# Patient Record
Sex: Female | Born: 1995 | Race: Black or African American | Hispanic: No | Marital: Single | Smoking: Never smoker
Health system: Southern US, Community
[De-identification: ages and names within clinical notes are randomized; demographics above are authoritative.]

## PROBLEM LIST (undated history)

## (undated) DIAGNOSIS — L309 Dermatitis, unspecified: Secondary | ICD-10-CM

## (undated) HISTORY — DX: Dermatitis, unspecified: L30.9

---

## 2020-08-09 ENCOUNTER — Other Ambulatory Visit: Payer: Self-pay

## 2020-08-09 ENCOUNTER — Encounter (HOSPITAL_BASED_OUTPATIENT_CLINIC_OR_DEPARTMENT_OTHER): Payer: Self-pay | Admitting: Emergency Medicine

## 2020-08-09 ENCOUNTER — Emergency Department (HOSPITAL_BASED_OUTPATIENT_CLINIC_OR_DEPARTMENT_OTHER)
Admission: EM | Admit: 2020-08-09 | Discharge: 2020-08-09 | Disposition: A | Discharge status: Home / Self Care | Payer: Medicaid Other | Attending: Emergency Medicine | Admitting: Emergency Medicine

## 2020-08-09 ENCOUNTER — Emergency Department (HOSPITAL_BASED_OUTPATIENT_CLINIC_OR_DEPARTMENT_OTHER): Payer: Medicaid Other

## 2020-08-09 DIAGNOSIS — R1013 Epigastric pain: Secondary | ICD-10-CM | POA: Insufficient documentation

## 2020-08-09 DIAGNOSIS — R112 Nausea with vomiting, unspecified: Secondary | ICD-10-CM | POA: Insufficient documentation

## 2020-08-09 LAB — URINALYSIS, ROUTINE W REFLEX MICROSCOPIC
Bilirubin Urine: NEGATIVE
Glucose, UA: NEGATIVE mg/dL
Ketones, ur: NEGATIVE mg/dL
Nitrite: NEGATIVE
Protein, ur: NEGATIVE mg/dL
Specific Gravity, Urine: 1.01 (ref 1.005–1.030)
pH: 7.5 (ref 5.0–8.0)

## 2020-08-09 LAB — CBC WITH DIFFERENTIAL/PLATELET
Abs Immature Granulocytes: 0.02 10*3/uL (ref 0.00–0.07)
Basophils Absolute: 0.1 10*3/uL (ref 0.0–0.1)
Basophils Relative: 1 %
Eosinophils Absolute: 0.2 10*3/uL (ref 0.0–0.5)
Eosinophils Relative: 2 %
HCT: 40.6 % (ref 36.0–46.0)
Hemoglobin: 13.1 g/dL (ref 12.0–15.0)
Immature Granulocytes: 0 %
Lymphocytes Relative: 46 %
Lymphs Abs: 4.2 10*3/uL — ABNORMAL HIGH (ref 0.7–4.0)
MCH: 30.4 pg (ref 26.0–34.0)
MCHC: 32.3 g/dL (ref 30.0–36.0)
MCV: 94.2 fL (ref 80.0–100.0)
Monocytes Absolute: 0.6 10*3/uL (ref 0.1–1.0)
Monocytes Relative: 6 %
Neutro Abs: 4.1 10*3/uL (ref 1.7–7.7)
Neutrophils Relative %: 45 %
Platelets: 244 10*3/uL (ref 150–400)
RBC: 4.31 MIL/uL (ref 3.87–5.11)
RDW: 11.4 % — ABNORMAL LOW (ref 11.5–15.5)
WBC: 9.1 10*3/uL (ref 4.0–10.5)
nRBC: 0 % (ref 0.0–0.2)

## 2020-08-09 LAB — URINALYSIS, MICROSCOPIC (REFLEX): RBC / HPF: >50 RBC/hpf (ref 0–5)

## 2020-08-09 LAB — COMPREHENSIVE METABOLIC PANEL
ALT: 13 U/L (ref 0–44)
AST: 16 U/L (ref 15–41)
Albumin: 4.4 g/dL (ref 3.5–5.0)
Alkaline Phosphatase: 49 U/L (ref 38–126)
Anion gap: 9 (ref 5–15)
BUN: 14 mg/dL (ref 6–20)
CO2: 25 mmol/L (ref 22–32)
Calcium: 9 mg/dL (ref 8.9–10.3)
Chloride: 104 mmol/L (ref 98–111)
Creatinine, Ser: 1.11 mg/dL — ABNORMAL HIGH (ref 0.44–1.00)
GFR, Estimated: >60 mL/min (ref >60)
Glucose, Bld: 139 mg/dL — ABNORMAL HIGH (ref 70–99)
Potassium: 3.4 mmol/L — ABNORMAL LOW (ref 3.5–5.1)
Sodium: 138 mmol/L (ref 135–145)
Total Bilirubin: 0.5 mg/dL (ref 0.3–1.2)
Total Protein: 7.4 g/dL (ref 6.5–8.1)

## 2020-08-09 LAB — PREGNANCY, URINE: Preg Test, Ur: NEGATIVE

## 2020-08-09 LAB — LIPASE, BLOOD: Lipase: 46 U/L (ref 11–51)

## 2020-08-09 MED ORDER — PANTOPRAZOLE SODIUM 40 MG IV SOLR
40.0000 mg | Freq: Once | INTRAVENOUS | Status: AC
  Administered 2020-08-09: 40 mg via INTRAVENOUS
  Filled 2020-08-09: qty 40

## 2020-08-09 MED ORDER — IOHEXOL 9 MG/ML PO SOLN
500.0000 mL | Freq: Two times a day (BID) | ORAL | Status: DC | PRN
  Administered 2020-08-09: 500 mL via ORAL

## 2020-08-09 MED ORDER — FENTANYL CITRATE (PF) 100 MCG/2ML IJ SOLN
50.0000 ug | Freq: Once | INTRAMUSCULAR | Status: DC
  Filled 2020-08-09: qty 2

## 2020-08-09 MED ORDER — IOHEXOL 300 MG/ML  SOLN
100.0000 mL | Freq: Once | INTRAMUSCULAR | Status: AC | PRN
  Administered 2020-08-09: 60 mL via INTRAVENOUS

## 2020-08-09 MED ORDER — ONDANSETRON HCL 4 MG/2ML IJ SOLN
4.0000 mg | Freq: Once | INTRAMUSCULAR | Status: AC
  Administered 2020-08-09: 4 mg via INTRAVENOUS
  Filled 2020-08-09: qty 2

## 2020-08-09 NOTE — ED Triage Notes (Signed)
Pt c/o awaking with abd pain just prior to arrival and vomited in lobby. Denies diarrhea.

## 2020-08-09 NOTE — ED Provider Notes (Signed)
[facility] Provider Note: Lowella Dell, MD, FACEP  CSN: [id number] MRN: [id number] ARRIVAL: 08/09/20 at 0302 ROOM: MH09/[facility]09   CHIEF COMPLAINT  Abdominal Pain   HISTORY OF PRESENT ILLNESS  08/09/20 3:15 AM Yvonne Oneal is a 24 y.o. female who was awakened from sleep just prior to arrival with epigastric pain.  She describes the pain is sharp and rates it as a 6 out of 10.  It is worse with movement or palpation.  She has had nausea and one episode of vomiting.  She denies fever, diarrhea or dysuria.  Her menses began 1 day ago.   History reviewed. No pertinent past medical history.  Past Surgical History:  Procedure Laterality Date  . CESAREAN SECTION      No family history on file.  Social History   Tobacco Use  . Smoking status: Never Smoker  . Smokeless tobacco: Never Used  Substance Use Topics  . Alcohol use: Yes  . Drug use: Not on file    Prior to Admission medications   Not on File    Allergies Other   REVIEW OF SYSTEMS  Negative except as noted here or in the History of Present Illness.   PHYSICAL EXAMINATION  Initial Vital Signs Blood pressure 110/90, pulse 94, temperature 98.2 F (36.8 C), temperature source Oral, resp. rate 18, height 5\' 1"  (1.549 m), weight 43.5 kg, last menstrual period 08/08/2020, SpO2 99 %.  Examination General: Well-developed, well-nourished female in no acute distress; appearance consistent with age of record HENT: normocephalic; atraumatic Eyes: pupils equal, round and reactive to light; extraocular muscles intact Neck: supple Heart: regular rate and rhythm Lungs: clear to auscultation bilaterally Abdomen: soft; nondistended; epigastric tenderness and mild suprapubic tenderness; bowel sounds present Extremities: No deformity; full range of motion; pulses normal Neurologic: Awake, alert and oriented; motor function intact in all extremities and symmetric; no facial droop Skin: Warm and dry Psychiatric:  Flat affect   RESULTS  Summary of this visit's results, reviewed and interpreted by myself:   EKG Interpretation  Date/Time:    Ventricular Rate:    PR Interval:    QRS Duration:   QT Interval:    QTC Calculation:   R Axis:     Text Interpretation:        Laboratory Studies: Results for orders placed or performed during the hospital encounter of 08/09/20 (from the past 24 hour(s))  Urinalysis, Routine w reflex microscopic Urine, Clean Catch     Status: Abnormal   Collection Time: 08/09/20  3:17 AM  Result Value Ref Range   Color, Urine AMBER (A) YELLOW   APPearance CLOUDY (A) CLEAR   Specific Gravity, Urine 1.010 1.005 - 1.030   pH 7.5 5.0 - 8.0   Glucose, UA NEGATIVE NEGATIVE mg/dL   Hgb urine dipstick LARGE (A) NEGATIVE   Bilirubin Urine NEGATIVE NEGATIVE   Ketones, ur NEGATIVE NEGATIVE mg/dL   Protein, ur NEGATIVE NEGATIVE mg/dL   Nitrite NEGATIVE NEGATIVE   Leukocytes,Ua TRACE (A) NEGATIVE  Pregnancy, urine     Status: None   Collection Time: 08/09/20  3:17 AM  Result Value Ref Range   Preg Test, Ur NEGATIVE NEGATIVE  Urinalysis, Microscopic (reflex)     Status: Abnormal   Collection Time: 08/09/20  3:17 AM  Result Value Ref Range   RBC / HPF >50 0 - 5 RBC/hpf   WBC, UA 6-10 0 - 5 WBC/hpf   Bacteria, UA FEW (A) NONE SEEN   Squamous Epithelial /  LPF 0-5 0 - 5  CBC with Differential/Platelet     Status: Abnormal   Collection Time: 08/09/20  3:24 AM  Result Value Ref Range   WBC 9.1 4.0 - 10.5 K/uL   RBC 4.31 3.87 - 5.11 MIL/uL   Hemoglobin 13.1 12.0 - 15.0 g/dL   HCT 50.5 36 - 46 %   MCV 94.2 80.0 - 100.0 fL   MCH 30.4 26.0 - 34.0 pg   MCHC 32.3 30.0 - 36.0 g/dL   RDW 39.7 (L) 67.3 - 41.9 %   Platelets 244 150 - 400 K/uL   nRBC 0.0 0.0 - 0.2 %   Neutrophils Relative % 45 %   Neutro Abs 4.1 1.7 - 7.7 K/uL   Lymphocytes Relative 46 %   Lymphs Abs 4.2 (H) 0.7 - 4.0 K/uL   Monocytes Relative 6 %   Monocytes Absolute 0.6 0.1 - 1.0 K/uL   Eosinophils  Relative 2 %   Eosinophils Absolute 0.2 0.0 - 0.5 K/uL   Basophils Relative 1 %   Basophils Absolute 0.1 0.0 - 0.1 K/uL   Immature Granulocytes 0 %   Abs Immature Granulocytes 0.02 0.00 - 0.07 K/uL  Comprehensive metabolic panel     Status: Abnormal   Collection Time: 08/09/20  3:24 AM  Result Value Ref Range   Sodium 138 135 - 145 mmol/L   Potassium 3.4 (L) 3.5 - 5.1 mmol/L   Chloride 104 98 - 111 mmol/L   CO2 25 22 - 32 mmol/L   Glucose, Bld 139 (H) 70 - 99 mg/dL   BUN 14 6 - 20 mg/dL   Creatinine, Ser 3.79 (H) 0.44 - 1.00 mg/dL   Calcium 9.0 8.9 - 02.4 mg/dL   Total Protein 7.4 6.5 - 8.1 g/dL   Albumin 4.4 3.5 - 5.0 g/dL   AST 16 15 - 41 U/L   ALT 13 0 - 44 U/L   Alkaline Phosphatase 49 38 - 126 U/L   Total Bilirubin 0.5 0.3 - 1.2 mg/dL   GFR, Estimated >09 >73 mL/min   Anion gap 9 5 - 15  Lipase, blood     Status: None   Collection Time: 08/09/20  3:24 AM  Result Value Ref Range   Lipase 46 11 - 51 U/L   Imaging Studies: CT ABDOMEN PELVIS W CONTRAST  Result Date: 08/09/2020 CLINICAL DATA:  Abdominal pain and vomiting. EXAM: CT ABDOMEN AND PELVIS WITH CONTRAST TECHNIQUE: Multidetector CT imaging of the abdomen and pelvis was performed using the standard protocol following bolus administration of intravenous contrast. CONTRAST:  33mL OMNIPAQUE IOHEXOL 300 MG/ML  SOLN COMPARISON:  None. FINDINGS: Lower chest: Unremarkable. Hepatobiliary: No suspicious focal abnormality within the liver parenchyma. There is no evidence for gallstones, gallbladder wall thickening, or pericholecystic fluid. No intrahepatic or extrahepatic biliary dilation. Pancreas: No focal mass lesion. No dilatation of the main duct. No intraparenchymal cyst. No peripancreatic edema. Spleen: No splenomegaly. No focal mass lesion. Adrenals/Urinary Tract: No adrenal nodule or mass. Kidneys unremarkable. No evidence for hydroureter. The urinary bladder appears normal for the degree of distention. Stomach/Bowel: Stomach  is unremarkable. No gastric wall thickening. No evidence of outlet obstruction. Duodenum is normally positioned as is the ligament of Treitz. No small bowel wall thickening. No small bowel dilatation. The terminal ileum is normal. The appendix is best seen on coronal images and is unremarkable. Large colonic stool volume. Vascular/Lymphatic: No abdominal aortic aneurysm. No abdominal aortic atherosclerotic calcification. There is no gastrohepatic or hepatoduodenal ligament lymphadenopathy.  No retroperitoneal or mesenteric lymphadenopathy. No pelvic sidewall lymphadenopathy. Reproductive: The uterus is unremarkable.  There is no adnexal mass. Other: Trace free fluid noted in the cul-de-sac. Musculoskeletal: No worrisome lytic or sclerotic osseous abnormality. IMPRESSION: 1. No acute findings in the abdomen or pelvis. Specifically, no findings to explain the patient's history of abdominal pain and vomiting. 2. Large colonic stool volume. Imaging features could be compatible with clinical constipation. Electronically Signed   By: Kennith Center M.D.   On: 08/09/2020 06:14    ED COURSE and MDM  Nursing notes, initial and subsequent vitals signs, including pulse oximetry, reviewed and interpreted by myself.  Vitals:   08/09/20 0310 08/09/20 0311 08/09/20 0615  BP: 110/90  106/70  Pulse: 94  72  Resp: 18  18  Temp: 98.2 F (36.8 C)    TempSrc: Oral    SpO2: 99%  99%  Weight:  43.5 kg   Height:  5\' 1"  (1.549 m)    Medications  fentaNYL (SUBLIMAZE) injection 50 mcg (50 mcg Intravenous Not Given 08/09/20 0329)  iohexol (OMNIPAQUE) 9 MG/ML oral solution 500 mL (500 mLs Oral Contrast Given 08/09/20 0431)  ondansetron (ZOFRAN) injection 4 mg (4 mg Intravenous Given 08/09/20 0328)  pantoprazole (PROTONIX) injection 40 mg (40 mg Intravenous Given 08/09/20 0449)  iohexol (OMNIPAQUE) 300 MG/ML solution 100 mL (60 mLs Intravenous Contrast Given 08/09/20 0532)   6:21 AM Patient's abdomen now soft, nontender.   Advised of reassuring CT and lab findings.  Her pain could be due to a ruptured corpus luteum cyst.  Although the CT suggest constipation her presentation was atypical for constipation and she states she has been constipated in the past without this particular pain.   PROCEDURES  Procedures   ED DIAGNOSES     ICD-10-CM   1. Epigastric pain  R10.13        08/11/20, MD 08/09/20 [phone]

## 2020-11-22 IMAGING — CT CT ABD-PELV W/ CM
2 of 4 series · 16 of 46 positions shown, 18 images · IV contrast (Omnipaque)
Comparison: None.

CLINICAL DATA: Abdominal pain and vomiting.

EXAM:
CT ABDOMEN AND PELVIS WITH CONTRAST
TECHNIQUE: Multidetector CT imaging of the abdomen and pelvis was performed
using the standard protocol following bolus administration of
intravenous contrast.
CONTRAST:  60mL OMNIPAQUE IOHEXOL 300 MG/ML  SOLN

[Series 2: axial st · axial · 0.60mm/px · z∈[+577,+917]mm · 13 of 76 slices shown, 15 images]
[im 4/76  soft-tissue]
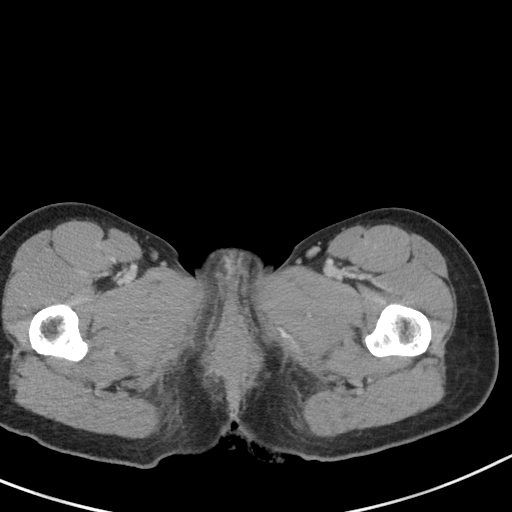
[im 4/76  bone]
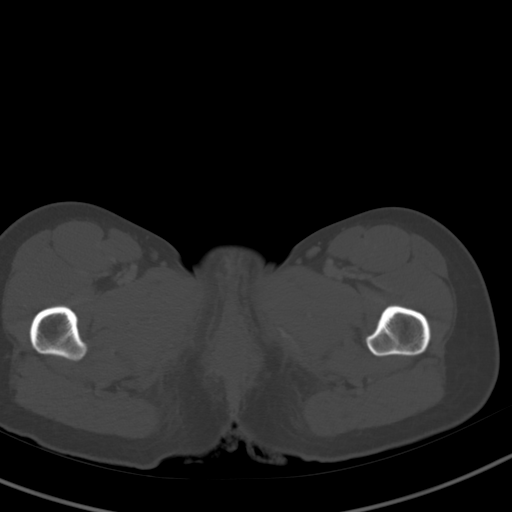
[im 10/76  soft-tissue]
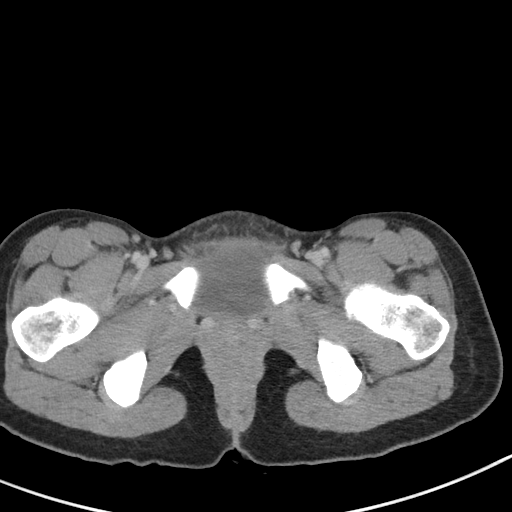
[im 17/76  soft-tissue]
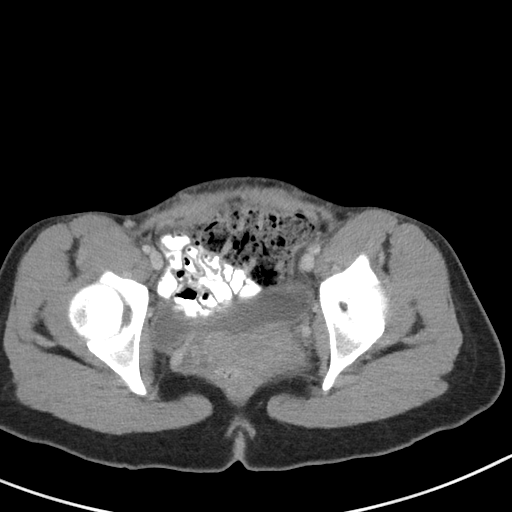
[im 20/76  soft-tissue]
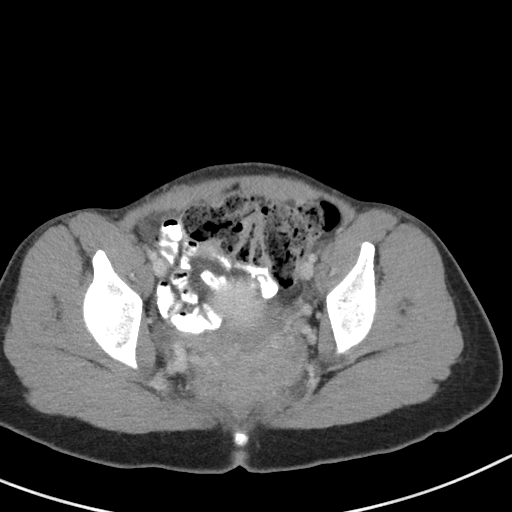
[im 27/76  soft-tissue]
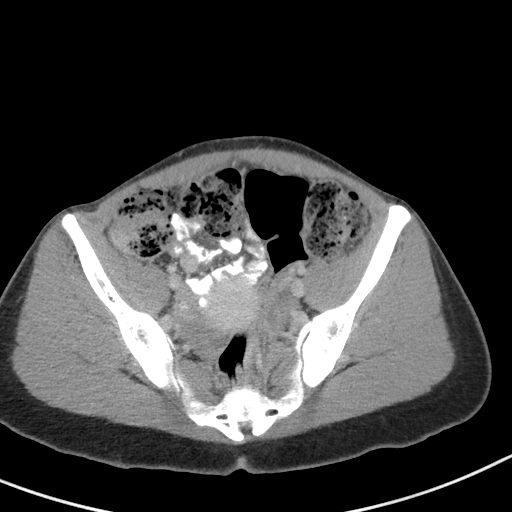
[im 33/76  soft-tissue]
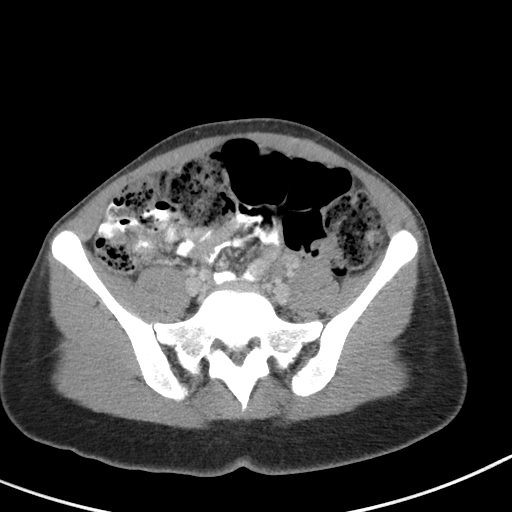
[im 40/76  soft-tissue]
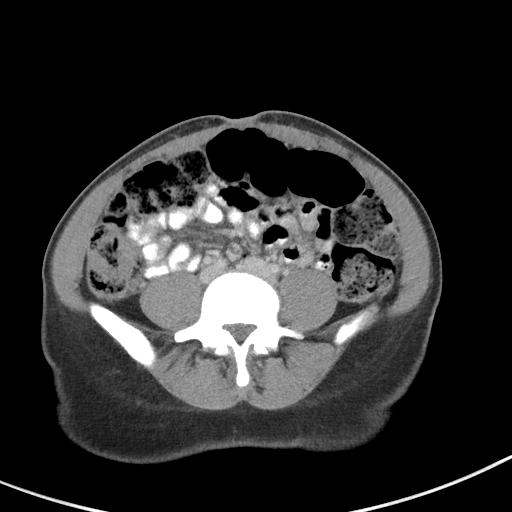
[im 43/76  soft-tissue]
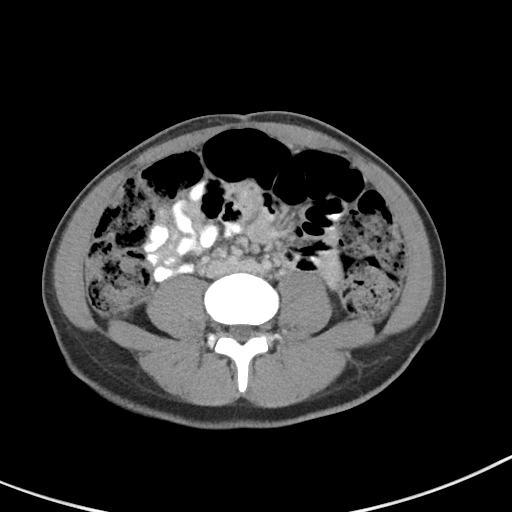
[im 49/76  soft-tissue]
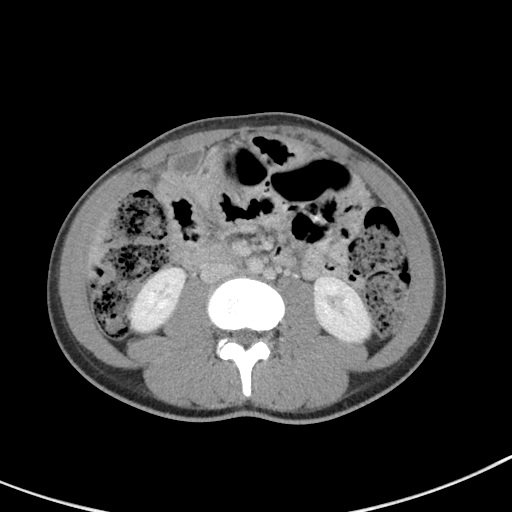
[im 49/76  bone]
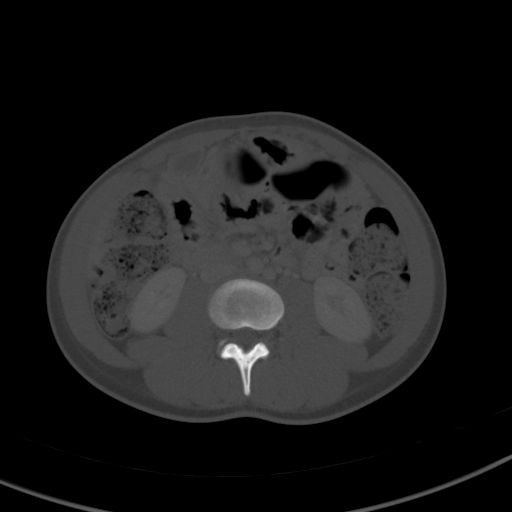
[im 56/76  soft-tissue]
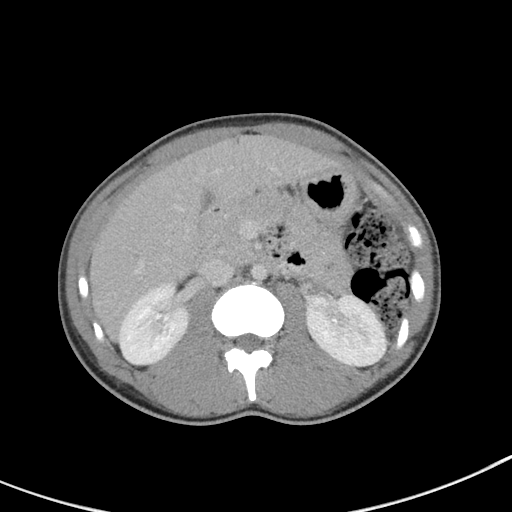
[im 59/76  soft-tissue]
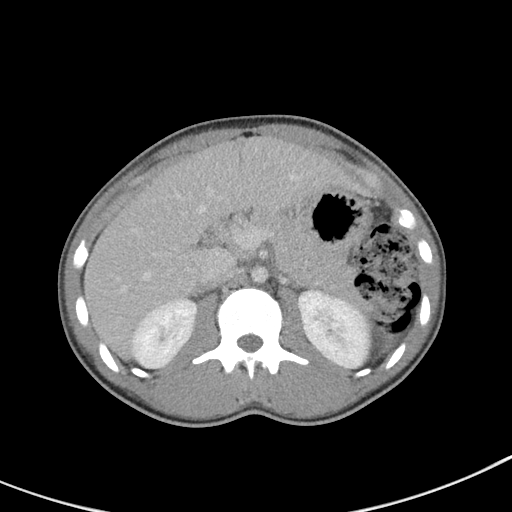
[im 66/76  soft-tissue]
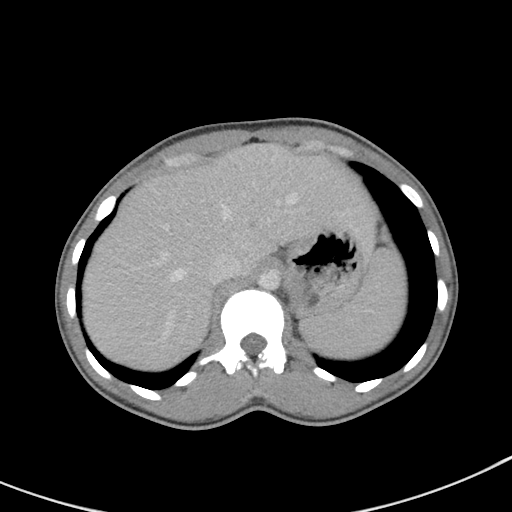
[im 72/76  soft-tissue]
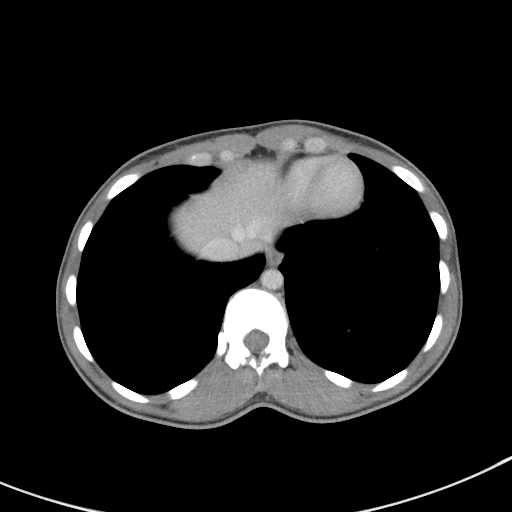

[Series 5: coronal st · coronal · 0.59mm/px · 3 of 62 slices shown]
[im 21/62  soft-tissue]
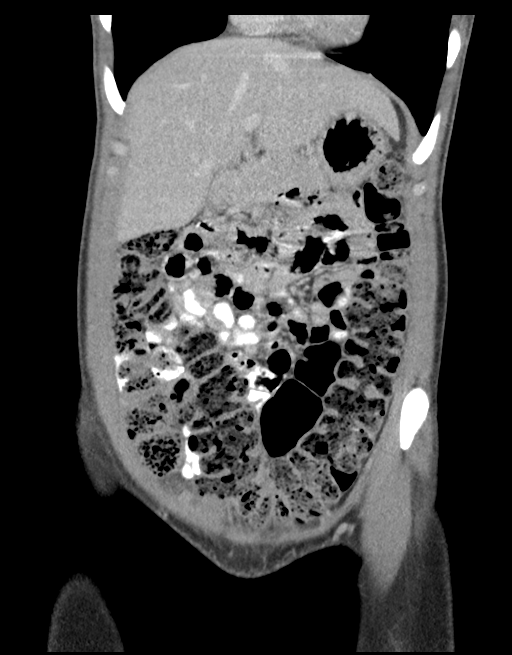
[im 28/62  soft-tissue]
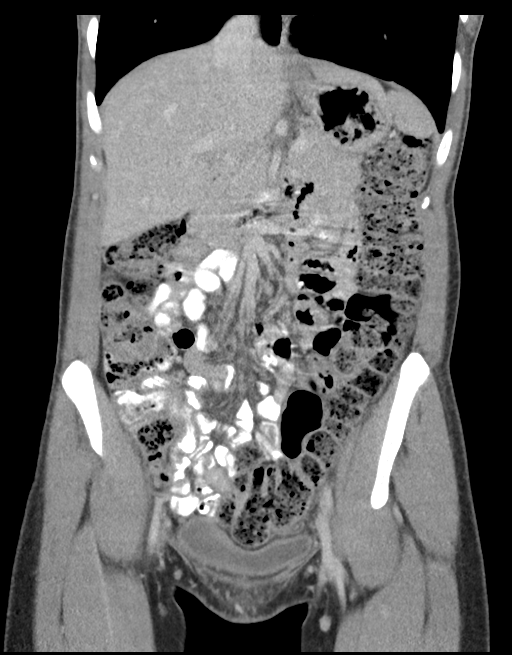
[im 34/62  soft-tissue]
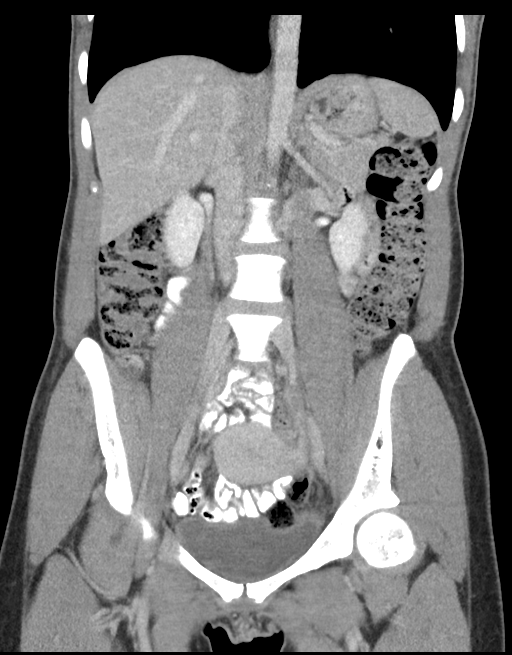

[16 of 46 positions shown; findings below may reference images not displayed]

FINDINGS: Lower chest: Unremarkable.

Hepatobiliary: No suspicious focal abnormality within the liver
parenchyma. There is no evidence for gallstones, gallbladder wall
thickening, or pericholecystic fluid. No intrahepatic or
extrahepatic biliary dilation.

Pancreas: No focal mass lesion. No dilatation of the main duct. No
intraparenchymal cyst. No peripancreatic edema.

Spleen: No splenomegaly. No focal mass lesion.

Adrenals/Urinary Tract: No adrenal nodule or mass. Kidneys
unremarkable. No evidence for hydroureter. The urinary bladder
appears normal for the degree of distention.

Stomach/Bowel: Stomach is unremarkable. No gastric wall thickening.
No evidence of outlet obstruction. Duodenum is normally positioned
as is the ligament of Treitz. No small bowel wall thickening. No
small bowel dilatation. The terminal ileum is normal. The appendix
is best seen on coronal images and is unremarkable. Large colonic
stool volume.

Vascular/Lymphatic: No abdominal aortic aneurysm. No abdominal
aortic atherosclerotic calcification. There is no gastrohepatic or
hepatoduodenal ligament lymphadenopathy. No retroperitoneal or
mesenteric lymphadenopathy. No pelvic sidewall lymphadenopathy.

Reproductive: The uterus is unremarkable.  There is no adnexal mass.

Other: Trace free fluid noted in the cul-de-sac.

Musculoskeletal: No worrisome lytic or sclerotic osseous
abnormality.
IMPRESSION: 1. No acute findings in the abdomen or pelvis. Specifically, no
findings to explain the patient's history of abdominal pain and
vomiting.
2. Large colonic stool volume. Imaging features could be compatible
with clinical constipation.

## 2022-09-09 ENCOUNTER — Ambulatory Visit: Payer: Medicaid Other | Admitting: Allergy

## 2022-10-10 ENCOUNTER — Other Ambulatory Visit: Payer: Self-pay

## 2022-10-10 ENCOUNTER — Encounter: Payer: Self-pay | Admitting: Internal Medicine

## 2022-10-10 ENCOUNTER — Ambulatory Visit (INDEPENDENT_AMBULATORY_CARE_PROVIDER_SITE_OTHER): Payer: Medicaid Other | Admitting: Internal Medicine

## 2022-10-10 VITALS — BP 90/64 | HR 102 | Temp 97.9°F | Resp 16 | Ht 61.25 in | Wt 100.0 lb

## 2022-10-10 DIAGNOSIS — Z91018 Allergy to other foods: Secondary | ICD-10-CM | POA: Diagnosis not present

## 2022-10-10 DIAGNOSIS — H1013 Acute atopic conjunctivitis, bilateral: Secondary | ICD-10-CM

## 2022-10-10 DIAGNOSIS — J3089 Other allergic rhinitis: Secondary | ICD-10-CM | POA: Diagnosis not present

## 2022-10-10 DIAGNOSIS — T7801XA Anaphylactic reaction due to peanuts, initial encounter: Secondary | ICD-10-CM

## 2022-10-10 DIAGNOSIS — J302 Other seasonal allergic rhinitis: Secondary | ICD-10-CM

## 2022-10-10 MED ORDER — CETIRIZINE HCL 10 MG PO TABS: 10.0000 mg | ORAL_TABLET | Freq: Every day | ORAL | 5 refills | Status: DC

## 2022-10-10 MED ORDER — FLUTICASONE PROPIONATE 50 MCG/ACT NA SUSP: 2.0000 | Freq: Every day | NASAL | 5 refills | Status: DC

## 2022-10-10 MED ORDER — HYDROCORTISONE 2.5 % EX OINT: TOPICAL_OINTMENT | CUTANEOUS | 5 refills | Status: DC

## 2022-10-10 MED ORDER — EPINEPHRINE 0.3 MG/0.3ML IJ SOAJ: 0.3000 mg | INTRAMUSCULAR | 1 refills | Status: DC | PRN

## 2022-10-10 NOTE — Progress Notes (Signed)
NEW PATIENT  Date of Service/Encounter:  10/10/22  Consult requested by: Joanna Puff, MD   Subjective:   Yvonne Oneal (DOB: [birth year]) is a 26 y.o. female who presents to the clinic on 10/10/2022 with a chief complaint of Allergy Testing (Pt states that she has a server peanut all tree nuts dogs and cats mild./Needs epi pen.) .    History obtained from: chart review and patient.   Rhinitis:  Started in childhood.  Symptoms include: nasal congestion, rhinorrhea, post nasal drainage, watery eyes, and itchy eyes  Occurs year-round Potential triggers:  Cats cause her to become itchy and red inflamed skin Dogs also cause similar symptoms.  She also feels like she has a lump in her throat sometimes when she sleeps; she does have a lot of trouble with anxiety and thinks that is contributing.  Treatments tried:  None  Previous allergy testing: yes in childhood; can't recall exact results  History of reflux/heartburn: no History of chronic sinusitis or sinus surgery: no  Atopic Dermatitis:  Diagnosed in childhood. It has improved.  Areas that flare commonly are antecubital fossa, neck, eyelids, behind knees, behind ears.  Current regimen: Aquaphor, not using any steroid cream   She does use body wash and laundry detergent are fragrance.  Identified triggers of flares include weather change Sleep is not affected  Concern for Food Allergy:  Foods of concern: peanut, treenut History of reaction:  Around age 53, she was exposed to peanut aerosolized and had facial swelling and broke out in hives and also had trouble breathing.  She was seen by an Allergist and was positive to peanuts and treenuts.  Since then has avoided it.  With accidental exposure to a treenut, she had trouble swallowing. She isn't sure what type of nut it was.   She has no interest in peanut reintroduction but would consider other treenuts.   Previous allergy testing yes positive to peanut/treenut in  childhood.  Carries an epinephrine autoinjector: yes she previously had one but has run out of refills.    Past Medical History: Past Medical History:  Diagnosis Date   Eczema    Past Surgical History: Past Surgical History:  Procedure Laterality Date   CESAREAN SECTION      Family History: Family History  Problem Relation Age of Onset   Allergic rhinitis Mother    Eczema Brother     Social History:  Lives in a 35 year house Flooring in bedroom: wood Pets: none Tobacco use/exposure: none Job: server  Medication List:  Allergies as of 10/10/2022       Reactions   Sulfamethoxazole-trimethoprim Hives   Other    "antibiotic that starts with S"   Peanut Allergen Powder-dnfp    Tree nuts: smelling cause swelling        Medication List        Accurate as of October 10, 2022 12:48 PM. If you have any questions, ask your nurse or doctor.          cetirizine 10 MG tablet Commonly known as: ZyrTEC Allergy Take 1 tablet (10 mg total) by mouth daily. Started by: Birder Robson, MD   EPINEPHrine 0.3 mg/0.3 mL Soaj injection Commonly known as: EpiPen 2-Pak Inject 0.3 mg into the muscle as needed for anaphylaxis. Started by: Birder Robson, MD   fluticasone 50 MCG/ACT nasal spray Commonly known as: FLONASE Place 2 sprays into both nostrils daily. Started by: Birder Robson, MD   hydrocortisone 2.5 %  ointment Apply twice daily for eczema flare ups up to maximum 7 to 10 days. Started by: Birder Robson, MD         REVIEW OF SYSTEMS: Pertinent positives and negatives discussed in HPI.   Objective:   Physical Exam: BP 90/64 (BP Location: Left Arm, Patient Position: Sitting, Cuff Size: Normal)   Pulse (!) 102   Temp 97.9 F (36.6 C) (Temporal)   Resp 16   Ht 5' 1.25" (1.556 m)   Wt 100 lb (45.4 kg)   SpO2 100%   BMI 18.74 kg/m  Body mass index is 18.74 kg/m. GEN: alert, well developed HEENT: clear conjunctiva, TM grey and translucent, nose with +  inferior turbinate hypertrophy, pink nasal mucosa, slight clear rhinorrhea, + cobblestoning HEART: regular rate and rhythm, no murmur LUNGS: clear to auscultation bilaterally, no coughing, unlabored respiration ABDOMEN: soft, non distended  SKIN: no rashes or lesions  Reviewed:  No records  Skin Testing:  Skin prick testing was placed, which includes aeroallergens/foods, histamine control, and saline control.  Verbal consent was obtained prior to placing test.  Patient tolerated procedure well.  Allergy testing results were read and interpreted by myself, documented by clinical staff. Adequate positive and negative control.  Results discussed with patient/family.  Airborne Adult Perc - 10/10/22 1051     Time Antigen Placed 1100    Allergen Manufacturer Greer    Location Back    Number of Test -58    Panel 1 Select    2. Control-Histamine 1 mg/ml 3+    3. Albumin saline Negative    4. Bahia Negative    5. French Southern Territories Negative    6. Johnson Negative    7. Kentucky Blue Negative    8. Meadow Fescue Negative    9. Perennial Rye Negative    10. Sweet Vernal Negative    11. Timothy Negative    12. Cocklebur Negative    13. Burweed Marshelder Negative    14. Ragweed, short Negative    15. Ragweed, Giant Negative    16. Plantain,  English Negative    17. Lamb's Quarters Negative    18. Sheep Sorrell Negative    19. Rough Pigweed Negative    20. Marsh Elder, Rough Negative    21. Mugwort, Common Negative    22. Ash mix Negative    23. Birch mix Negative    24. Beech American Negative    25. Box, Elder Negative    26. Cedar, red Negative    27. Cottonwood, Guinea-Bissau Negative    28. Elm mix 3+    29. Hickory 3+    30. Maple mix Negative    31. Oak, Guinea-Bissau mix 3+    32. Pecan Pollen 3+    33. Pine mix Negative    34. Sycamore Eastern Negative    35. Walnut, Black Pollen Negative    36. Alternaria alternata Negative    37. Cladosporium Herbarum Negative    38. Aspergillus mix  Negative    39. Penicillium mix Negative    40. Bipolaris sorokiniana (Helminthosporium) Negative    41. Drechslera spicifera (Curvularia) Negative    42. Mucor plumbeus Negative    43. Fusarium moniliforme Negative    44. Aureobasidium pullulans (pullulara) Negative    45. Rhizopus oryzae Negative    46. Botrytis cinera Negative    47. Epicoccum nigrum Negative    48. Phoma betae Negative    49. Candida Albicans Negative    50. Trichophyton  mentagrophytes Negative    51. Mite, D Farinae  5,000 AU/ml Negative    52. Mite, D Pteronyssinus  5,000 AU/ml Negative    53. Cat Hair 10,000 BAU/ml 3+    54.  Dog Epithelia Negative    55. Mixed Feathers Negative    56. Horse Epithelia Negative    57. Cockroach, German Negative    58. Mouse Negative    59. Tobacco Leaf Negative             Food Adult Perc - 10/10/22 1000     Time Antigen Placed 1100    Allergen Manufacturer Greer    Location Back    Number of allergen test 8    1. Peanut Negative    10. Cashew Negative    11. Pecan Food Negative    12. Walnut Food Negative    13. Almond Negative    14. Hazelnut Negative    15. [place] nut Negative    17. Pistachio Negative               Assessment:   1. Peanut-induced anaphylaxis, initial encounter   2. Seasonal and perennial allergic rhinitis   3. 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  We will obtain sIgE to peanut and treenut.  She would be interested in treenut reintroduction but not peanut.  - please strictly avoid peanut and treenuts.  - for SKIN only reaction, okay to take Benadryl 25mg  capsules every 6 hours - for SKIN + ANY additional symptoms, OR IF concern for LIFE THREATENING reaction = Epipen Autoinjector EpiPen 0.3 mg. - If using Epinephrine autoinjector, call 911 or emergency room.   Allergic Rhinitis Allergic  Conjunctivitis  - Positive skin test 09/2022: trees and cats - Avoidance measures discussed. - Use nasal saline rinses before nose sprays such as with Neilmed Sinus Rinse.  Use distilled water.   - Use Flonase 2 sprays each nostril daily. Aim upward and outward. - Use Zyrtec 10 mg daily as needed for runny nose or itchy watery eyes.  - Consider allergy shots as long term control of your symptoms by teaching your immune system to be more tolerant of your allergy triggers   Eczema: - Do a daily soaking tub bath in warm water for 10-15 minutes.  - Use a gentle, unscented cleanser at the end of the bath (such as Dove unscented bar or baby wash, or Aveeno sensitive body wash). Then rinse, pat half-way dry, and apply a gentle, unscented moisturizer cream or ointment (Cerave, Cetaphil, Eucerin, Aveeno) all over while still damp. Dry skin makes the itching and rash of eczema worse. The skin should be moisturized with a gentle, unscented moisturizer at least twice daily.  - Use only unscented liquid laundry detergent. - Apply prescribed topical steroid (hydrocortisone 2.5% above neck) to flared areas (red and thickened eczema) after the moisturizer has soaked into the skin (wait at least 30 minutes). Taper off the topical steroids as the skin improves. Do not use topical steroid for more than 7-10 days at a time.   Return in about 6 weeks (around 11/21/2022).  11/23/2022, MD [facility]

## 2022-10-10 NOTE — Patient Instructions (Addendum)
Food allergy:  - today's skin testing was negative for peanuts and treenuts.  We will obtain sIgE to peanut and treenut.   - please strictly avoid peanut and treenuts.  - for SKIN only reaction, okay to take Benadryl 25mg  capsules every 6 hours - for SKIN + ANY additional symptoms, OR IF concern for LIFE THREATENING reaction = Epipen Autoinjector EpiPen 0.3 mg. - If using Epinephrine autoinjector, call 911 or emergency room.   Allergic Rhinitis Allergic Conjunctivitis  - Positive skin test 09/2022: trees and cats - Avoidance measures discussed. - Use nasal saline rinses before nose sprays such as with Neilmed Sinus Rinse.  Use distilled water.   - Use Flonase 2 sprays each nostril daily. Aim upward and outward. - Use Zyrtec 10 mg daily as needed for runny nose or itchy watery eyes.  - Consider allergy shots as long term control of your symptoms by teaching your immune system to be more tolerant of your allergy triggers   Eczema: - Do a daily soaking tub bath in warm water for 10-15 minutes.  - Use a gentle, unscented cleanser at the end of the bath (such as Dove unscented bar or baby wash, or Aveeno sensitive body wash). Then rinse, pat half-way dry, and apply a gentle, unscented moisturizer cream or ointment (Cerave, Cetaphil, Eucerin, Aveeno) all over while still damp. Dry skin makes the itching and rash of eczema worse. The skin should be moisturized with a gentle, unscented moisturizer at least twice daily.  - Use only unscented liquid laundry detergent. - Apply prescribed topical steroid (hydrocortisone 2.5% above neck) to flared areas (red and thickened eczema) after the moisturizer has soaked into the skin (wait at least 30 minutes). Taper off the topical steroids as the skin improves. Do not use topical steroid for more than 7-10 days at a time.    ALLERGEN AVOIDANCE MEASURES Pollen Avoidance Pollen levels are highest during the mid-day and afternoon.  Consider this when planning  outdoor activities. Avoid being outside when the grass is being mowed, or wear a mask if the pollen-allergic person must be the one to mow the grass. Keep the windows closed to keep pollen outside of the home. Use an air conditioner to filter the air. Take a shower, wash hair, and change clothing after working or playing outdoors during pollen season. Pet Dander Keep the pet out of your bedroom and restrict it to only a few rooms. Be advised that keeping the pet in only one room will not limit the allergens to that room. Don't pet, hug or kiss the pet; if you do, wash your hands with soap and water. High-efficiency particulate air (HEPA) cleaners run continuously in a bedroom or living room can reduce allergen levels over time. Regular use of a high-efficiency vacuum cleaner or a central vacuum can reduce allergen levels. Giving your pet a bath at least once a week can reduce airborne allergen.

## 2022-10-30 ENCOUNTER — Other Ambulatory Visit: Payer: Self-pay | Admitting: Internal Medicine

## 2022-10-30 ENCOUNTER — Telehealth: Payer: Self-pay

## 2022-10-30 NOTE — Telephone Encounter (Signed)
Pharmacy states that a PA is needed for generic Epipen due to brand being out of stock and unavailable.

## 2022-10-30 NOTE — Telephone Encounter (Signed)
Sent to PA team 

## 2022-10-31 ENCOUNTER — Other Ambulatory Visit: Payer: Self-pay | Admitting: Internal Medicine

## 2022-10-31 ENCOUNTER — Other Ambulatory Visit: Payer: Self-pay | Admitting: *Deleted

## 2022-10-31 MED ORDER — EPIPEN 2-PAK 0.3 MG/0.3ML IJ SOAJ: INTRAMUSCULAR | 1 refills | Status: DC

## 2022-11-03 ENCOUNTER — Other Ambulatory Visit (HOSPITAL_COMMUNITY): Payer: Self-pay

## 2022-11-03 ENCOUNTER — Telehealth: Payer: Self-pay

## 2022-11-03 NOTE — Telephone Encounter (Signed)
PA request received through providers office for EPINEPHrine 0.3MG /0.3ML auto-injectors  PA has been submitted via CMM to [employer] and is APPROVED from 11/03/2022-11/04/2023

## 2022-11-03 NOTE — Telephone Encounter (Signed)
PA has been started and will be updated in additional encounter. 

## 2022-11-22 LAB — IGE NUT PROF. W/COMPONENT RFLX
F017-IgE Hazelnut (Filbert): <0.1 kU/L
F018-IgE Brazil Nut: <0.1 kU/L
F020-IgE Almond: <0.1 kU/L
F202-IgE Cashew Nut: <0.1 kU/L
F203-IgE Pistachio Nut: <0.1 kU/L
F256-IgE Walnut: <0.1 kU/L
Macadamia Nut, IgE: <0.1 kU/L
Peanut, IgE: 24.1 kU/L — AB
Pecan Nut IgE: <0.1 kU/L

## 2022-11-22 LAB — PEANUT COMPONENTS
F352-IgE Ara h 8: <0.1 kU/L
F422-IgE Ara h 1: 7.15 kU/L — AB
F423-IgE Ara h 2: 23.7 kU/L — AB
F424-IgE Ara h 3: 0.27 kU/L — AB
F427-IgE Ara h 9: <0.1 kU/L
F447-IgE Ara h 6: 17.3 kU/L — AB

## 2022-11-22 LAB — ALLERGEN COMPONENT COMMENTS

## 2022-11-28 ENCOUNTER — Other Ambulatory Visit: Payer: Self-pay

## 2022-11-28 ENCOUNTER — Encounter: Payer: Self-pay | Admitting: Internal Medicine

## 2022-11-28 ENCOUNTER — Ambulatory Visit (INDEPENDENT_AMBULATORY_CARE_PROVIDER_SITE_OTHER): Payer: Medicaid Other | Admitting: Internal Medicine

## 2022-11-28 VITALS — BP 110/70 | HR 82 | Temp 97.6°F | Resp 16 | Ht 61.0 in | Wt 102.0 lb

## 2022-11-28 DIAGNOSIS — L2084 Intrinsic (allergic) eczema: Secondary | ICD-10-CM

## 2022-11-28 DIAGNOSIS — Z91018 Allergy to other foods: Secondary | ICD-10-CM

## 2022-11-28 DIAGNOSIS — J3089 Other allergic rhinitis: Secondary | ICD-10-CM

## 2022-11-28 DIAGNOSIS — T7801XA Anaphylactic reaction due to peanuts, initial encounter: Secondary | ICD-10-CM | POA: Diagnosis not present

## 2022-11-28 DIAGNOSIS — J302 Other seasonal allergic rhinitis: Secondary | ICD-10-CM

## 2022-11-28 MED ORDER — HYDROCORTISONE 2.5 % EX OINT: TOPICAL_OINTMENT | CUTANEOUS | 5 refills | Status: AC

## 2022-11-28 MED ORDER — AZELASTINE HCL 0.1 % NA SOLN: 1.0000 | Freq: Two times a day (BID) | NASAL | 5 refills | Status: AC

## 2022-11-28 MED ORDER — FLUTICASONE PROPIONATE 50 MCG/ACT NA SUSP: 2.0000 | Freq: Every day | NASAL | 5 refills | Status: AC

## 2022-11-28 MED ORDER — CETIRIZINE HCL 10 MG PO TABS: 10.0000 mg | ORAL_TABLET | Freq: Every day | ORAL | 5 refills | Status: AC

## 2022-11-28 MED ORDER — EPINEPHRINE 0.3 MG/0.3ML IJ SOAJ: INTRAMUSCULAR | 1 refills | Status: AC

## 2022-11-28 NOTE — Progress Notes (Signed)
FOLLOW UP Date of Service/Encounter:  11/28/22   Subjective:  Yvonne Oneal (DOB: 01-Dec-1995) is a 27 y.o. female who returns to the [facility] [place] on 11/28/2022 for follow up for allergic rhinoconjunctivits, eczema and food allergies.   History obtained from: chart review and patient.  Last visit was 10/10/2022 with me and was positive to trees/cats started on Flonase/Zyrtec.  SPT was negative to treenuts and peanuts but sIgE was positive to peanut and negative to treenuts- recommend challenge to treenut.  Eczema doing well with PRN topical steroid.  Since last visit, she reports still having a lot of congestion and runny nose. She is doing Flonase and Zyrtec PRN rather than daily.  She is considering AIT.  She has never tried Azelastine.  Not much ocular symptoms.  In terms of food allergies, avoiding peanuts and treenuts. She is interested in treenut reintroduction as her SPT and blood test were negative.  Has an Epipen.  No accidental exposures.  Eczema is doing well.  Rarely needs topical steroids.  Uses Aquaphor to moisturize.    Past Medical History: Past Medical History:  Diagnosis Date   Eczema     Objective:  BP 110/70   Pulse 82   Temp 97.6 F (36.4 C) (Temporal)   Resp 16   Ht 5\' 1"  (1.549 m)   Wt 102 lb (46.3 kg)   SpO2 100%   BMI 19.27 kg/m  Body mass index is 19.27 kg/m. Physical Exam: GEN: alert, well developed HEENT: clear conjunctiva, TM grey and translucent, nose with moderate inferior turbinate hypertrophy, pink nasal mucosa, clear rhinorrhea, no cobblestoning HEART: regular rate and rhythm, no murmur LUNGS: clear to auscultation bilaterally, no coughing, unlabored respiration SKIN: no rashes or lesions  Assessment:   1. Seasonal and perennial allergic rhinitis   2. 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 Intrinsic atopic dermatitis     Plan/Recommendations:   Food allergy:  - Skin testing was  negative for peanuts and treenuts 09/2022. sIgE to peanut was positive but negative to treenut 10/2022.  Recommend treenut challenge.   - Schedule pistachio challenge.  If she passes that then she can eat cashews as they are cross reactive.  Make sure you hold all anti-histamines 5 days prior to the challenge.   - Consider next challenge to walnut or pecan.  And then also to almonds.  - please strictly avoid peanut and treenuts for now.  - for SKIN only reaction, okay to take Benadryl 25mg  capsules every 6 hours - for SKIN + ANY additional symptoms, OR IF concern for LIFE THREATENING reaction = Epipen Autoinjector EpiPen 0.3 mg. - If using Epinephrine autoinjector, call 911 or emergency room.   Allergic Rhinitis Allergic Conjunctivitis  - Uncontrolled, discussed using INCS daily and adding INAH.  Also given info on allergy shots.  - Positive skin test 09/2022: trees and cats - Avoidance measures discussed. - Use nasal saline rinses before nose sprays such as with Neilmed Sinus Rinse.  Use distilled water.   - Use Flonase 2 sprays each nostril daily. Aim upward and outward. - Use Azelastine 1-2 sprays each nostril twice daily as needed.   - Use Zyrtec 10 mg daily as needed for runny nose or itchy watery eyes.  - Consider allergy shots as long term control of your symptoms by teaching your immune system to be more tolerant of your allergy triggers.  Given information on allergy shots. Also discussed possibility of  [facility] AIT.    Eczema: - Controlled with daily moisturizing, rare use of topical steroids.  - Do a daily soaking tub bath in warm water for 10-15 minutes.  - Use a gentle, unscented cleanser at the end of the bath (such as Dove unscented bar or baby wash, or Aveeno sensitive body wash). Then rinse, pat half-way dry, and apply a gentle, unscented moisturizer cream or ointment (Cerave, Cetaphil, Eucerin, Aveeno) all over while still damp. Dry skin makes the itching and rash of eczema  worse. The skin should be moisturized with a gentle, unscented moisturizer at least twice daily.  - Use only unscented liquid laundry detergent. - Apply prescribed topical steroid (hydrocortisone 2.5% above neck) to flared areas (red and thickened eczema) after the moisturizer has soaked into the skin (wait at least 30 minutes). Taper off the topical steroids as the skin improves. Do not use topical steroid for more than 7-10 days at a time.      Return in about 2 months (around 01/27/2023).  Harlon Flor, MD [facility]

## 2022-11-28 NOTE — Patient Instructions (Addendum)
Food challenge to pistachio whenever available Routine follow up in 2 months.  Food allergy:  - today's skin testing was negative for peanuts and treenuts. sIgE to peanut was positive but negative to treenut.  - Schedule pistachio challenge.  If she passes that then she can eat cashews.  Make sure you hold all anti-histamines 5 days prior to the challenge.   - Consider next challenge to walnut or pecan.  And then also to almonds.  - please strictly avoid peanut and treenuts.  - for SKIN only reaction, okay to take Benadryl 25mg  capsules every 6 hours - for SKIN + ANY additional symptoms, OR IF concern for LIFE THREATENING reaction = Epipen Autoinjector EpiPen 0.3 mg. - If using Epinephrine autoinjector, call 911 or emergency room.   Allergic Rhinitis Allergic Conjunctivitis  - Positive skin test 09/2022: trees and cats - Avoidance measures discussed. - Use nasal saline rinses before nose sprays such as with Neilmed Sinus Rinse.  Use distilled water.   - Use Flonase 2 sprays each nostril daily. Aim upward and outward. - Use Azelastine 1-2 sprays each nostril twice daily as needed.   - Use Zyrtec 10 mg daily as needed for runny nose or itchy watery eyes.  - Consider allergy shots as long term control of your symptoms by teaching your immune system to be more tolerant of your allergy triggers.  Given information on allergy shots.    Eczema: - Do a daily soaking tub bath in warm water for 10-15 minutes.  - Use a gentle, unscented cleanser at the end of the bath (such as Dove unscented bar or baby wash, or Aveeno sensitive body wash). Then rinse, pat half-way dry, and apply a gentle, unscented moisturizer cream or ointment (Cerave, Cetaphil, Eucerin, Aveeno) all over while still damp. Dry skin makes the itching and rash of eczema worse. The skin should be moisturized with a gentle, unscented moisturizer at least twice daily.  - Use only unscented liquid laundry detergent. - Apply prescribed  topical steroid (hydrocortisone 2.5% above neck) to flared areas (red and thickened eczema) after the moisturizer has soaked into the skin (wait at least 30 minutes). Taper off the topical steroids as the skin improves. Do not use topical steroid for more than 7-10 days at a time.

## 2022-12-08 ENCOUNTER — Encounter: Payer: Medicaid Other | Admitting: Family Medicine

## 2024-09-26 ENCOUNTER — Other Ambulatory Visit: Payer: Self-pay | Admitting: Internal Medicine
